# Patient Record
Sex: Male | Born: 1965 | Race: Black or African American | Hispanic: No | Marital: Single | Smoking: Current every day smoker
Health system: Southern US, Community
[De-identification: ages and names within clinical notes are randomized; demographics above are authoritative.]

## PROBLEM LIST (undated history)

## (undated) DIAGNOSIS — E119 Type 2 diabetes mellitus without complications: Secondary | ICD-10-CM

## (undated) DIAGNOSIS — W3400XA Accidental discharge from unspecified firearms or gun, initial encounter: Secondary | ICD-10-CM

---

## 2021-10-14 ENCOUNTER — Ambulatory Visit (INDEPENDENT_AMBULATORY_CARE_PROVIDER_SITE_OTHER): Payer: BLUE CROSS/BLUE SHIELD

## 2021-10-14 ENCOUNTER — Other Ambulatory Visit: Payer: Self-pay

## 2021-10-14 ENCOUNTER — Encounter (HOSPITAL_COMMUNITY): Payer: Self-pay | Admitting: Emergency Medicine

## 2021-10-14 ENCOUNTER — Ambulatory Visit (HOSPITAL_COMMUNITY)
Admission: EM | Admit: 2021-10-14 | Discharge: 2021-10-14 | Disposition: A | Discharge status: Home / Self Care | Payer: BLUE CROSS/BLUE SHIELD | Attending: Emergency Medicine | Admitting: Emergency Medicine

## 2021-10-14 DIAGNOSIS — G8929 Other chronic pain: Secondary | ICD-10-CM

## 2021-10-14 DIAGNOSIS — M5410 Radiculopathy, site unspecified: Secondary | ICD-10-CM

## 2021-10-14 DIAGNOSIS — M25511 Pain in right shoulder: Secondary | ICD-10-CM

## 2021-10-14 DIAGNOSIS — M542 Cervicalgia: Secondary | ICD-10-CM

## 2021-10-14 HISTORY — DX: Accidental discharge from unspecified firearms or gun, initial encounter: W34.00XA

## 2021-10-14 MED ORDER — KETOROLAC TROMETHAMINE 60 MG/2ML IM SOLN
60.0000 mg | Freq: Once | INTRAMUSCULAR | Status: AC
  Administered 2021-10-14: 60 mg via INTRAMUSCULAR

## 2021-10-14 MED ORDER — METHYLPREDNISOLONE 4 MG PO TBPK
ORAL_TABLET | ORAL | 0 refills | Status: DC
Start: 2021-10-14 — End: 2022-03-05

## 2021-10-14 MED ORDER — BACLOFEN 10 MG PO TABS
10.0000 mg | ORAL_TABLET | Freq: Every day | ORAL | 0 refills | Status: AC
Start: 2021-10-14 — End: 2021-10-21

## 2021-10-14 MED ORDER — KETOROLAC TROMETHAMINE 60 MG/2ML IM SOLN
INTRAMUSCULAR | Status: AC
  Filled 2021-10-14: qty 2
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
## 2021-10-14 NOTE — Discharge Instructions (Signed)
To rapidly address your pain, you received an injection of ketorolac, a strong anti-inflammatory pain medication.  Beginning tomorrow morning, please begin taking methylprednisolone, take 1 row of tablets daily until complete.  I also recommend that you begin taking a muscle relaxer at nighttime, baclofen, 1 tablet 1 hour before you go to bed.  I provided you with a referral to primary care, you should hear from someone in the next few days to help you get established.  I have also provided you with the names of 2 local orthopedic practices that you can reach out to for an appointment.  These 2 practices will have access to the x-rays that were performed here at urgent care today.  Please feel free to return to urgent care for repeat ketorolac injection if you feel this is beneficial.  While you are waiting for your appointment with orthopedics, recommend that you abstain from any type of resistance training in your upper limbs.

## 2021-11-27 ENCOUNTER — Encounter (HOSPITAL_BASED_OUTPATIENT_CLINIC_OR_DEPARTMENT_OTHER): Payer: Self-pay | Admitting: Urology

## 2021-11-27 ENCOUNTER — Emergency Department (HOSPITAL_BASED_OUTPATIENT_CLINIC_OR_DEPARTMENT_OTHER)
Admission: EM | Admit: 2021-11-27 | Discharge: 2021-11-28 | Disposition: A | Discharge status: Home / Self Care | Payer: BLUE CROSS/BLUE SHIELD | Attending: Emergency Medicine | Admitting: Emergency Medicine

## 2021-11-27 ENCOUNTER — Other Ambulatory Visit: Payer: Self-pay

## 2021-11-27 DIAGNOSIS — L0231 Cutaneous abscess of buttock: Secondary | ICD-10-CM | POA: Insufficient documentation

## 2021-11-27 MED ORDER — LIDOCAINE-EPINEPHRINE 2 %-1:200000 IJ SOLN
10.0000 mL | Freq: Once | INTRAMUSCULAR | Status: AC
Start: 2021-11-27 — End: 2021-11-27
  Administered 2021-11-27: 10 mL
  Filled 2021-11-27: qty 20
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
## 2021-11-27 NOTE — ED Triage Notes (Signed)
Abscess to upper buttocks started 2 days  No drainage  Painful to sit

## 2021-11-28 MED ORDER — DOXYCYCLINE HYCLATE 100 MG PO CAPS: 100.0000 mg | ORAL_CAPSULE | Freq: Two times a day (BID) | ORAL | 0 refills | Status: AC

## 2021-11-28 MED ORDER — OXYCODONE-ACETAMINOPHEN 5-325 MG PO TABS
1.0000 | ORAL_TABLET | Freq: Once | ORAL | Status: AC
Start: 2021-11-28 — End: 2021-11-28
  Administered 2021-11-28: 1 via ORAL
  Filled 2021-11-28: qty 1

## 2021-11-28 MED ORDER — SENNOSIDES-DOCUSATE SODIUM 8.6-50 MG PO TABS: 1.0000 | ORAL_TABLET | Freq: Every evening | ORAL | 0 refills | Status: AC | PRN

## 2021-11-28 MED ORDER — DOXYCYCLINE HYCLATE 100 MG PO TABS
100.0000 mg | ORAL_TABLET | Freq: Once | ORAL | Status: AC
Start: 2021-11-28 — End: 2021-11-28
  Administered 2021-11-28: 100 mg via ORAL
  Filled 2021-11-28: qty 1

## 2021-11-28 MED ORDER — OXYCODONE-ACETAMINOPHEN 5-325 MG PO TABS: 1.0000 | ORAL_TABLET | Freq: Four times a day (QID) | ORAL | 0 refills | Status: DC | PRN

## 2021-11-28 NOTE — Discharge Instructions (Signed)
You were seen in the emergency room today with a skin abscess.  I was able to drain this and have started you on antibiotics.  Please take the full course of antibiotics even if you are feeling better.  If you develop worsening pain or fever you should return for reevaluation.  I have called in a small number of pain medications but she cannot take these with other pain medicines or with alcohol.  You cannot drive while taking this medication.  This can cause constipation and have also called in a prescription for constipation medicine.

## 2022-03-05 ENCOUNTER — Ambulatory Visit (HOSPITAL_COMMUNITY)
Admission: EM | Admit: 2022-03-05 | Discharge: 2022-03-05 | Disposition: A | Discharge status: Home / Self Care | Payer: Self-pay | Attending: Internal Medicine | Admitting: Internal Medicine

## 2022-03-05 ENCOUNTER — Encounter (HOSPITAL_COMMUNITY): Payer: Self-pay | Admitting: Emergency Medicine

## 2022-03-05 DIAGNOSIS — L0501 Pilonidal cyst with abscess: Secondary | ICD-10-CM

## 2022-03-05 DIAGNOSIS — L0591 Pilonidal cyst without abscess: Secondary | ICD-10-CM

## 2022-03-05 MED ORDER — ACETAMINOPHEN 500 MG PO TABS: 1000.0000 mg | ORAL_TABLET | Freq: Four times a day (QID) | ORAL | 0 refills | Status: AC | PRN

## 2022-03-05 MED ORDER — LIDOCAINE-EPINEPHRINE 1 %-1:100000 IJ SOLN
INTRAMUSCULAR | Status: AC
  Filled 2022-03-05: qty 1

## 2022-03-05 MED ORDER — IBUPROFEN 600 MG PO TABS: 600.0000 mg | ORAL_TABLET | Freq: Four times a day (QID) | ORAL | 0 refills | Status: AC | PRN

## 2022-03-05 MED ORDER — DOXYCYCLINE HYCLATE 100 MG PO CAPS: 100.0000 mg | ORAL_CAPSULE | Freq: Two times a day (BID) | ORAL | 0 refills | Status: DC

## 2022-03-05 NOTE — Discharge Instructions (Addendum)
You were seen for your pilonidal cyst today.  We drained the cyst and left open for it to continue to drain while you are at home.  Please take doxycycline twice daily for the next 10 days for infection with food.  You may take ibuprofen and Tylenol every 6 hours as needed for pain and inflammation.  Change the dressing to your wound twice a day with nonstick gauze as the incision heals.  When you shower, gently massage the area to further express drainage with clean hands.   If you develop any new or worsening symptoms or do not improve in the next 2 to 3 days, please return.  If your symptoms are severe, please go to the emergency room.  Follow-up with your primary care provider for further evaluation and management of your symptoms as well as ongoing wellness visits.  I hope you feel better!
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
## 2022-03-05 NOTE — ED Triage Notes (Signed)
Pt is present today with an abscess in between his buttock. Pt states that he noticed it yesterday

## 2022-04-08 ENCOUNTER — Ambulatory Visit (HOSPITAL_COMMUNITY)
Admission: EM | Admit: 2022-04-08 | Discharge: 2022-04-08 | Disposition: A | Discharge status: Home / Self Care | Payer: Self-pay | Attending: Physician Assistant | Admitting: Physician Assistant

## 2022-04-08 ENCOUNTER — Encounter (HOSPITAL_COMMUNITY): Payer: Self-pay | Admitting: Emergency Medicine

## 2022-04-08 DIAGNOSIS — L02211 Cutaneous abscess of abdominal wall: Secondary | ICD-10-CM

## 2022-04-08 DIAGNOSIS — L0291 Cutaneous abscess, unspecified: Secondary | ICD-10-CM

## 2022-04-08 DIAGNOSIS — R109 Unspecified abdominal pain: Secondary | ICD-10-CM

## 2022-04-08 MED ORDER — HYDROCODONE-ACETAMINOPHEN 5-325 MG PO TABS: 1.0000 | ORAL_TABLET | Freq: Four times a day (QID) | ORAL | 0 refills | Status: AC | PRN

## 2022-04-08 MED ORDER — SULFAMETHOXAZOLE-TRIMETHOPRIM 800-160 MG PO TABS: 1.0000 | ORAL_TABLET | Freq: Two times a day (BID) | ORAL | 0 refills | Status: AC
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
## 2022-04-08 NOTE — ED Triage Notes (Signed)
Pt c/o redden, swollen, raised area on right hip that has been ongoing intermittently for years. Over past several days become larger and more painful. Reports when touches area and then smells hand, has bad odor. Denies drainage.

## 2022-04-08 NOTE — Discharge Instructions (Addendum)
Advised to use warm compresses frequently throughout the day to help treat the abscess. Advised to take Vicodin tablets 1-2 every 6-8 hours as needed for pain relief, use with caution as this may make you drowsy. Advised to take the Bactrim DS 1 every 12 hours until completed. Advised to follow-up with PCP or return to urgent care if area fails to open and resolve on its own.

## 2022-04-11 ENCOUNTER — Other Ambulatory Visit: Payer: Self-pay

## 2022-04-11 ENCOUNTER — Emergency Department (HOSPITAL_COMMUNITY)
Admission: EM | Admit: 2022-04-11 | Discharge: 2022-04-12 | Disposition: A | Discharge status: Home / Self Care | Payer: Self-pay | Attending: Emergency Medicine | Admitting: Emergency Medicine

## 2022-04-11 ENCOUNTER — Encounter (HOSPITAL_COMMUNITY): Payer: Self-pay

## 2022-04-11 DIAGNOSIS — L02214 Cutaneous abscess of groin: Secondary | ICD-10-CM | POA: Insufficient documentation

## 2022-04-11 DIAGNOSIS — L0291 Cutaneous abscess, unspecified: Secondary | ICD-10-CM

## 2022-04-11 MED ORDER — HYDROCODONE-ACETAMINOPHEN 5-325 MG PO TABS
1.0000 | ORAL_TABLET | Freq: Once | ORAL | Status: AC
  Administered 2022-04-11: 1 via ORAL
  Filled 2022-04-11: qty 1

## 2022-04-11 NOTE — ED Provider Triage Note (Signed)
Emergency Medicine Provider Triage Evaluation Note  Zachary Kelly , a 56 y.o. male  was evaluated in triage.  Pt complains of abscess of the right hip.  History of previous abscesses.  Patient has had a bump on his hip for many years suddenly became swollen tender and has been progressively worsening.  Seen in urgent care earlier this week and prescribed antibiotics.  These have not resolved his symptoms.  Patient feels he needs I&D.  No fever or chills  Review of Systems  Positive: Abscess Negative: Fever  Physical Exam  BP 119/72 (BP Location: Right Arm)   Pulse 71   Temp 98.3 F (36.8 C) (Oral)   Resp 18   SpO2 97%  Gen:   Awake, no distress   Resp:  Normal effort  MSK:   Moves extremities without difficulty  Other:  Large fluctuant tender abscess of the right hip   Medical Decision Making  Medically screening exam initiated at 5:42 PM.  Appropriate orders placed.  Zachary Kelly was informed that the remainder of the evaluation will be completed by another provider, this initial triage assessment does not replace that evaluation, and the importance of remaining in the ED until their evaluation is complete.  Patient with abscess.  needs I&D   Arthor Captain, PA-C 04/11/22 1745

## 2022-04-11 NOTE — ED Triage Notes (Signed)
Pt came in POV d/t a bump that is on his Right flank area. It is red, sore & swollen. Denies fever, rates pain 10/10. A/Ox4.

## 2022-04-12 MED ORDER — LIDOCAINE-EPINEPHRINE (PF) 2 %-1:200000 IJ SOLN
20.0000 mL | Freq: Once | INTRAMUSCULAR | Status: AC
  Administered 2022-04-12: 20 mL
  Filled 2022-04-12: qty 20

## 2022-04-12 NOTE — ED Provider Notes (Signed)
[facility] EMERGENCY DEPARTMENT Provider Note   CSN: [id number] Arrival date & time: 04/11/22  1617     History  Chief Complaint  Patient presents with   Bump to Right Flank    Zachary Kelly is a 56 y.o. male.  56 year old male presents today for evaluation of an abscess near his right groin.  Ongoing for about a week.  Started on antibiotics without improvement.  Denies fever, chills.  Has history of abscess but not in this location.  The history is provided by the patient. No language interpreter was used.       Home Medications Prior to Admission medications   Medication Sig Start Date End Date Taking? Authorizing Provider  acetaminophen (TYLENOL) 500 MG tablet Take 2 tablets (1,000 mg total) by mouth every 6 (six) hours as needed. 03/05/22   Carlisle Beers, FNP  doxycycline (VIBRAMYCIN) 100 MG capsule Take 1 capsule (100 mg total) by mouth 2 (two) times daily. 03/05/22   Carlisle Beers, FNP  ibuprofen (ADVIL) 600 MG tablet Take 1 tablet (600 mg total) by mouth every 6 (six) hours as needed. 03/05/22   Carlisle Beers, FNP  senna-docusate (SENOKOT-S) 8.6-50 MG tablet Take 1 tablet by mouth at bedtime as needed for mild constipation or moderate constipation. 11/28/21   Long, Arlyss Repress, MD  sulfamethoxazole-trimethoprim (BACTRIM DS) 800-160 MG tablet Take 1 tablet by mouth 2 (two) times daily for 7 days. 04/08/22 04/15/22  Ellsworth Lennox, PA-C      Allergies    Patient has no known allergies.    Review of Systems   Review of Systems  Constitutional:  Negative for chills and fever.  Skin:  Positive for wound.  All other systems reviewed and are negative.   Physical Exam Updated Vital Signs BP 121/90 (BP Location: Left Arm)   Pulse 63   Temp 98.3 F (36.8 C) (Oral)   Resp 18   SpO2 98%  Physical Exam Vitals and nursing note reviewed.  Constitutional:      General: He is not in acute distress.    Appearance: Normal appearance. He  is not ill-appearing.  HENT:     Head: Normocephalic and atraumatic.     Nose: Nose normal.  Eyes:     Conjunctiva/sclera: Conjunctivae normal.  Cardiovascular:     Rate and Rhythm: Normal rate and regular rhythm.  Pulmonary:     Effort: Pulmonary effort is normal. No respiratory distress.  Musculoskeletal:        General: No deformity.  Skin:    Findings: No rash.       Neurological:     Mental Status: He is alert.     ED Results / Procedures / Treatments   Labs (all labs ordered are listed, but only abnormal results are displayed) Labs Reviewed - No data to display  EKG None  Radiology No results found.  Procedures .Marland KitchenIncision and Drainage  Date/Time: 04/12/2022 6:08 AM  Performed by: Marita Kansas, PA-C Authorized by: Marita Kansas, PA-C   Consent:    Consent obtained:  Verbal   Consent given by:  Patient   Risks discussed:  Bleeding, incomplete drainage, pain and damage to other organs   Alternatives discussed:  No treatment Universal protocol:    Procedure explained and questions answered to patient or proxy's satisfaction: yes     Relevant documents present and verified: yes     Test results available : yes     Patient identity confirmed:  Verbally with patient Location:    Type:  Abscess   Size:  3cm   Location: Just above right iliac crest. Pre-procedure details:    Skin preparation:  Betadine and povidone-iodine Sedation:    Sedation type:  None Anesthesia:    Anesthesia method:  Local infiltration   Local anesthetic:  Lidocaine 2% WITH epi Procedure type:    Complexity:  Simple Procedure details:    Incision types:  Single straight   Incision depth:  Subcutaneous   Wound management:  Probed and deloculated, irrigated with saline and extensive cleaning   Drainage:  Purulent, bloody and serosanguinous   Drainage amount:  Copious   Packing materials:  1/4 in gauze Post-procedure details:    Procedure completion:  Tolerated well, no immediate  complications     Medications Ordered in ED Medications  lidocaine-EPINEPHrine (XYLOCAINE W/EPI) 2 %-1:200000 (PF) injection 20 mL (has no administration in time range)  HYDROcodone-acetaminophen (NORCO/VICODIN) 5-325 MG per tablet 1 tablet (1 tablet Oral Given 04/11/22 1844)    ED Course/ Medical Decision Making/ A&P                           Medical Decision Making Risk Prescription drug management.   56 year old male presents today for evaluation of abscess to just above his right iliac crest.  Ongoing now for several days.  On antibiotics without improvement.  Fluctuance present.  We will proceed with I&D.  He was prescribed Bactrim.  Discussed continuation of Bactrim.  He is currently on day 2.  Without fever.  Low concern for systemic infection.  Wound care discussed.  Return precautions discussed.  Discussed he will need to remove the packing strip gauze placed today in 2 days.  He does not have PCP.  Provided with [facility] information to set up PCP.   Final Clinical Impression(s) / ED Diagnoses Final diagnoses:  Abscess    Rx / DC Orders ED Discharge Orders     None         Marita Kansas, PA-C 04/12/22 0620    Melene Plan, DO 04/12/22 [phone]

## 2022-04-12 NOTE — Discharge Instructions (Addendum)
Your abscess was drained today.  Packing was put into the abscess.  This will need to removed in 48 hours.  If you have worsening swelling, drainage, fever please return for evaluation.  Continue taking the antibiotic that you were prescribed.  I have also attached information for [facility] [place] [facility] for you to establish primary care with.

## 2022-12-27 IMAGING — DX DG CERVICAL SPINE COMPLETE 4+V
4 series · 4 of 4 positions shown · non-contrast
Comparison: None.

CLINICAL DATA: Neck pain with right-sided radiculopathy

EXAM:
CERVICAL SPINE - COMPLETE 4+ VIEW

[c-spine lat]
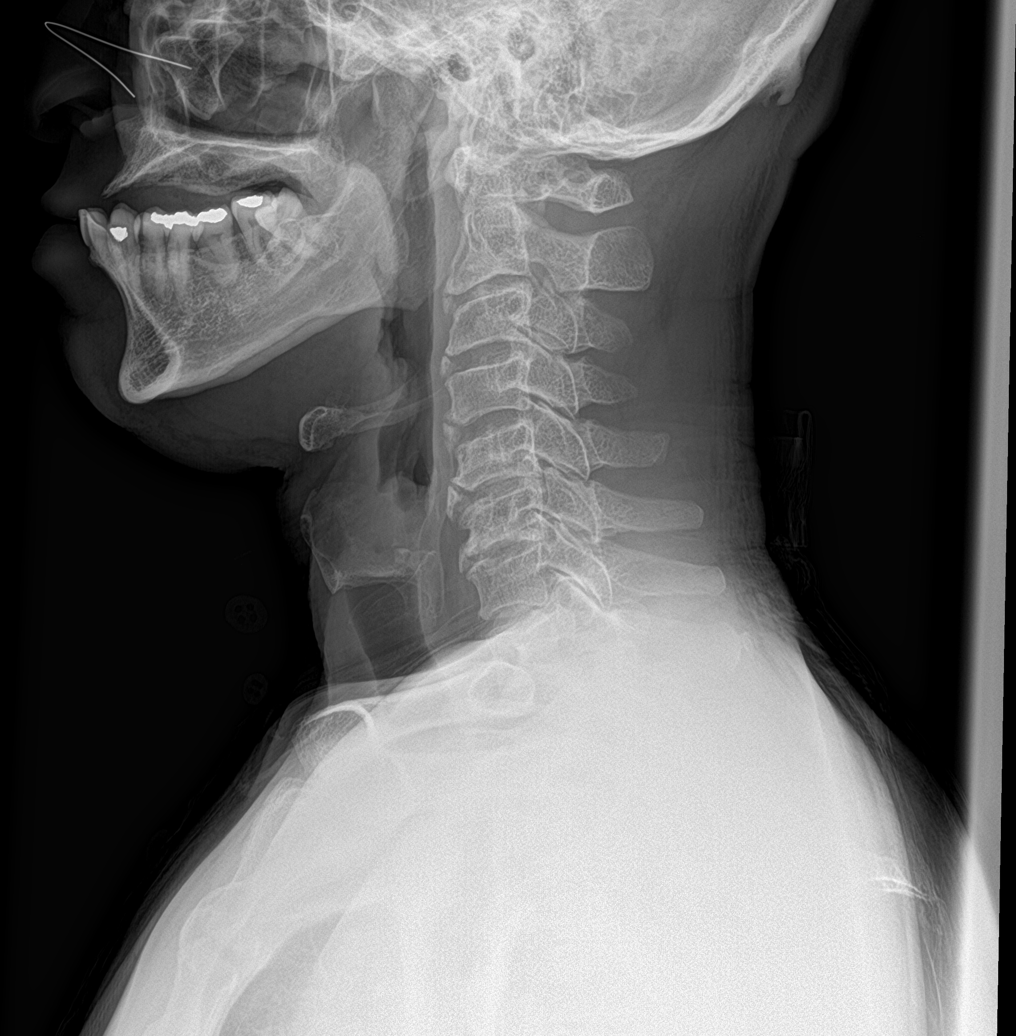

[c-spine obl (1 of 2)]
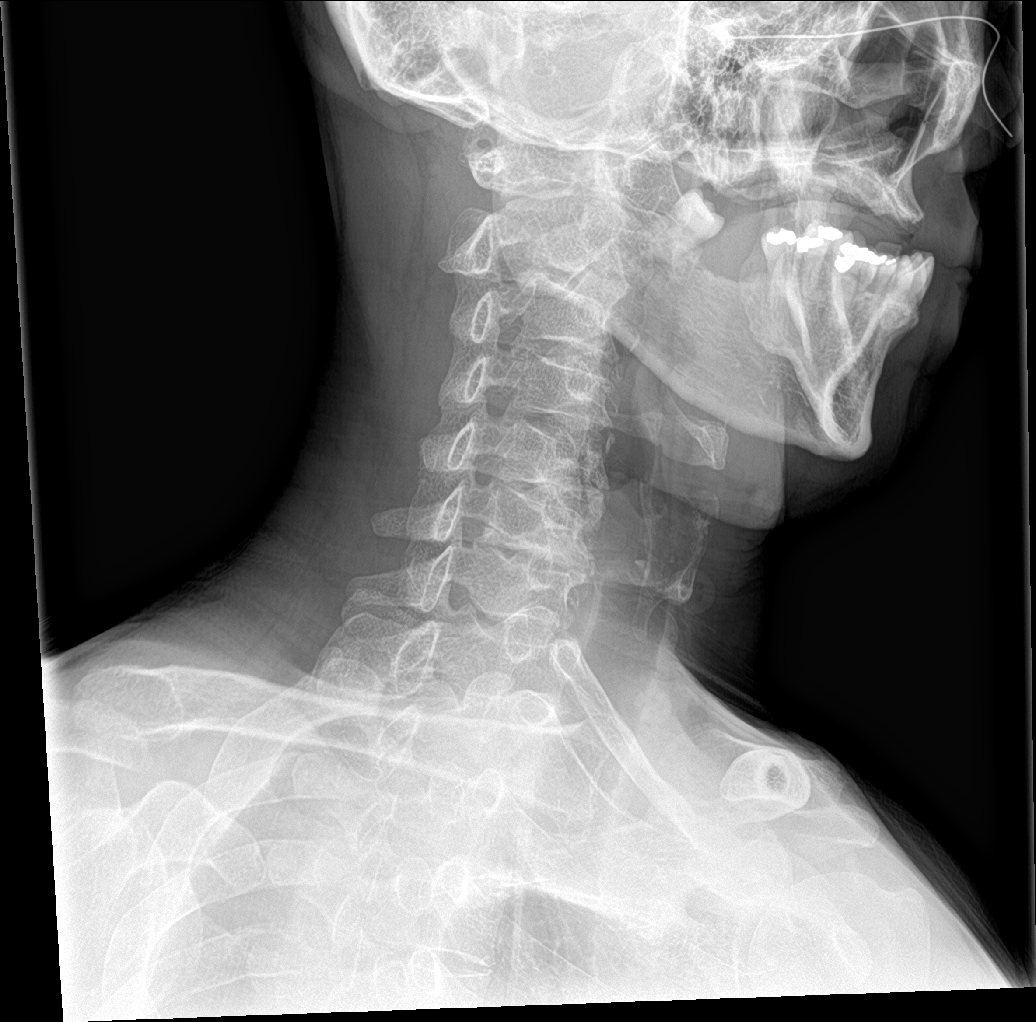

[c-spine obl (2 of 2)]
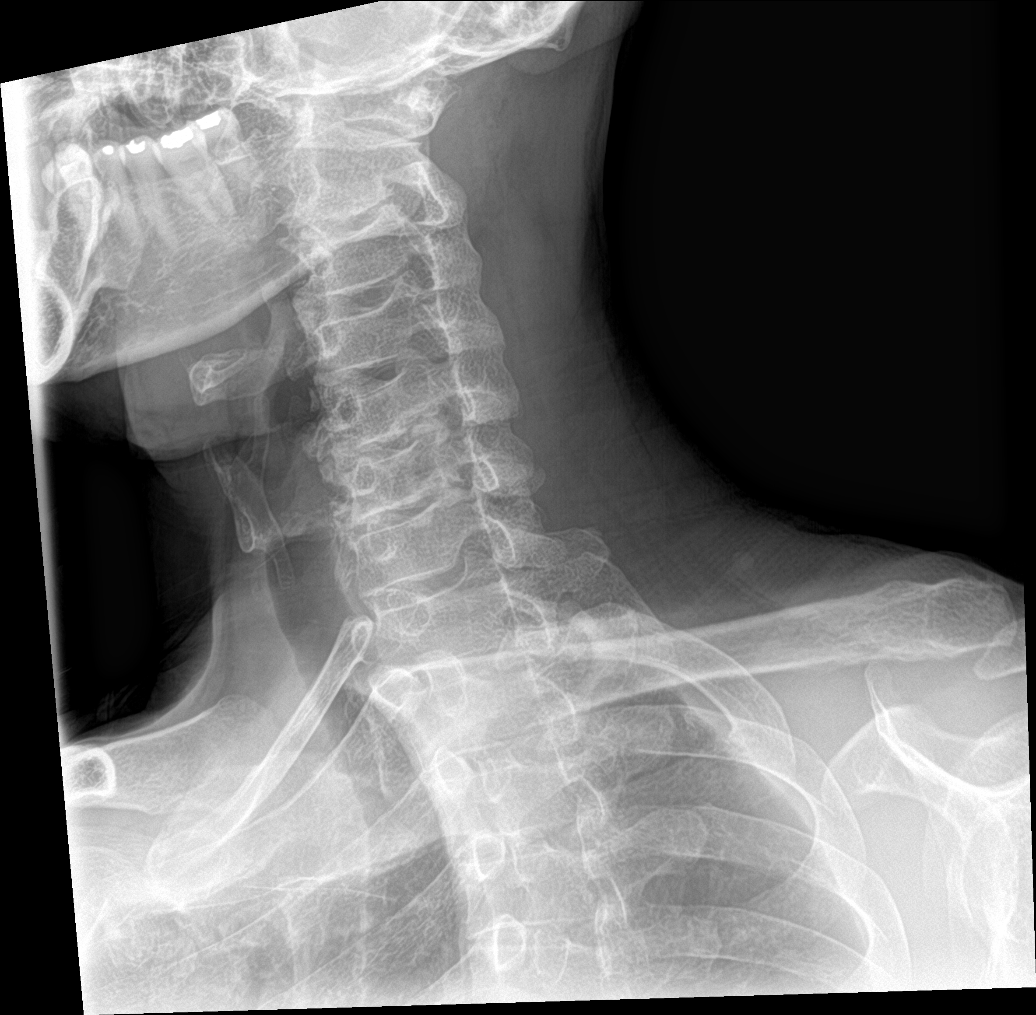

[c-spine ap]
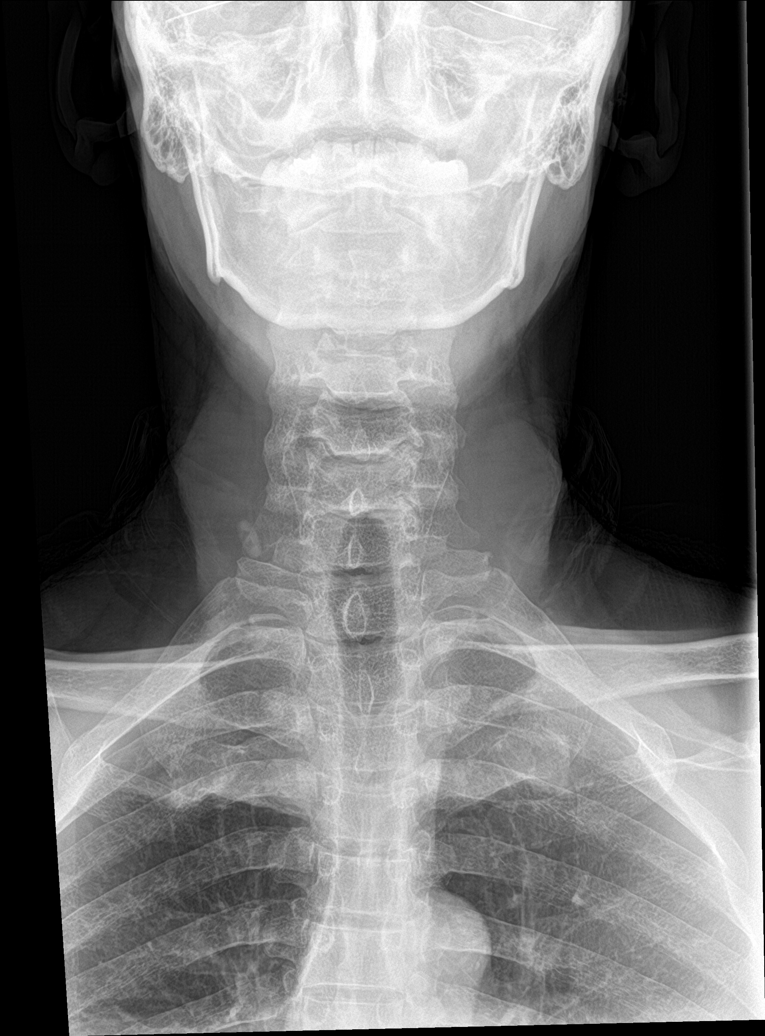

[4 of 4 positions shown; findings below may reference images not displayed]

FINDINGS: There is no evidence of cervical spine fracture or prevertebral soft
tissue swelling. Straightening of the cervical lordosis without
static listhesis. Mild disc height loss at C5-6 and C6-7. Oblique
views demonstrate bony foraminal crowding on the left at C6-7.
IMPRESSION: Mild degenerative disc disease at C5-6 and C6-7 with left-sided bony
foraminal crowding at C6-7.

## 2023-08-23 ENCOUNTER — Ambulatory Visit (HOSPITAL_COMMUNITY)
Admission: EM | Admit: 2023-08-23 | Discharge: 2023-08-23 | Disposition: A | Discharge status: Home / Self Care | Payer: 59 | Attending: Physician Assistant | Admitting: Physician Assistant

## 2023-08-23 ENCOUNTER — Encounter (HOSPITAL_COMMUNITY): Payer: Self-pay | Admitting: Emergency Medicine

## 2023-08-23 ENCOUNTER — Other Ambulatory Visit: Payer: Self-pay

## 2023-08-23 DIAGNOSIS — Z23 Encounter for immunization: Secondary | ICD-10-CM | POA: Diagnosis not present

## 2023-08-23 DIAGNOSIS — S40811A Abrasion of right upper arm, initial encounter: Secondary | ICD-10-CM | POA: Diagnosis not present

## 2023-08-23 DIAGNOSIS — W540XXA Bitten by dog, initial encounter: Secondary | ICD-10-CM

## 2023-08-23 HISTORY — DX: Type 2 diabetes mellitus without complications: E11.9

## 2023-08-23 MED ORDER — TETANUS-DIPHTH-ACELL PERTUSSIS 5-2.5-18.5 LF-MCG/0.5 IM SUSY
0.5000 mL | PREFILLED_SYRINGE | Freq: Once | INTRAMUSCULAR | Status: AC
  Administered 2023-08-23: 0.5 mL via INTRAMUSCULAR

## 2023-08-23 MED ORDER — TETANUS-DIPHTH-ACELL PERTUSSIS 5-2.5-18.5 LF-MCG/0.5 IM SUSY
PREFILLED_SYRINGE | INTRAMUSCULAR | Status: AC
  Filled 2023-08-23: qty 0.5

## 2023-08-23 MED ORDER — MUPIROCIN 2 % EX OINT
1.0000 | TOPICAL_OINTMENT | Freq: Two times a day (BID) | CUTANEOUS | 0 refills | Status: AC
Start: 2023-08-23 — End: ?

## 2023-08-23 MED ORDER — AMOXICILLIN-POT CLAVULANATE 875-125 MG PO TABS: 1.0000 | ORAL_TABLET | Freq: Two times a day (BID) | ORAL | 0 refills | Status: AC

## 2023-08-23 NOTE — Discharge Instructions (Signed)
We updated your tetanus today.  Start Augmentin twice daily for 7 days.  Keep the area clean with soap and water and apply Bactroban ointment with dressing changes.  Animal control should come out within the next day or so to pick up the dog and monitor them.  They will let you know if there is any concern for rabies and you need to start postexposure prophylaxis.  If there are any concerns you can return and we can start the postexposure prophylaxis for rabies.  If there are any signs of infection including redness, swelling, pain you need to be seen immediately.
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
## 2023-08-23 NOTE — ED Triage Notes (Signed)
3 am this morning.  Patient was house/dog sitting.  Patient was trying to get dogs out of house and one dog bit his right forearm.  Unknown when last tetanus shot was.    Unknown status of dog's shots record.  Currently calling owner to determine status.    Abrasion to right index finger/hand and right forearm
